# Patient Record
Sex: Male | Born: 1965 | Race: White | Hispanic: No | Marital: Married | Smoking: Never smoker
Health system: Southern US, Community
[De-identification: ages and names within clinical notes are randomized; demographics above are authoritative.]

---

## 2004-03-17 ENCOUNTER — Encounter: Admission: RE | Admit: 2004-03-17 | Discharge: 2004-03-17 | Payer: Self-pay | Admitting: Internal Medicine

## 2004-09-04 ENCOUNTER — Ambulatory Visit (HOSPITAL_COMMUNITY): Admission: RE | Admit: 2004-09-04 | Discharge: 2004-09-04 | Payer: Self-pay | Admitting: Family Medicine

## 2005-09-18 ENCOUNTER — Emergency Department: Payer: Self-pay | Admitting: Unknown Physician Specialty

## 2006-02-18 ENCOUNTER — Emergency Department (HOSPITAL_COMMUNITY): Admission: EM | Admit: 2006-02-18 | Discharge: 2006-02-18 | Payer: Self-pay | Admitting: Emergency Medicine

## 2008-05-26 ENCOUNTER — Emergency Department: Payer: Self-pay | Admitting: Emergency Medicine

## 2009-10-21 ENCOUNTER — Emergency Department: Payer: Self-pay | Admitting: Emergency Medicine

## 2011-07-09 ENCOUNTER — Ambulatory Visit (INDEPENDENT_AMBULATORY_CARE_PROVIDER_SITE_OTHER): Payer: 59 | Admitting: Internal Medicine

## 2011-07-09 ENCOUNTER — Encounter: Payer: Self-pay | Admitting: Internal Medicine

## 2011-07-09 VITALS — BP 146/90 | HR 65 | Temp 98.3°F | Resp 16 | Ht 73.0 in | Wt 231.2 lb

## 2011-07-09 DIAGNOSIS — M542 Cervicalgia: Secondary | ICD-10-CM | POA: Insufficient documentation

## 2011-07-09 DIAGNOSIS — M25551 Pain in right hip: Secondary | ICD-10-CM

## 2011-07-09 DIAGNOSIS — E291 Testicular hypofunction: Secondary | ICD-10-CM

## 2011-07-09 DIAGNOSIS — E349 Endocrine disorder, unspecified: Secondary | ICD-10-CM

## 2011-07-09 DIAGNOSIS — N529 Male erectile dysfunction, unspecified: Secondary | ICD-10-CM

## 2011-07-09 DIAGNOSIS — M5090 Cervical disc disorder, unspecified, unspecified cervical region: Secondary | ICD-10-CM

## 2011-07-09 DIAGNOSIS — M25559 Pain in unspecified hip: Secondary | ICD-10-CM

## 2011-07-09 MED ORDER — TRAMADOL HCL 50 MG PO TABS: 50.0000 mg | ORAL_TABLET | Freq: Three times a day (TID) | ORAL | Status: AC | PRN
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
## 2011-07-09 NOTE — Patient Instructions (Addendum)
Our office will call you with the appt for the x rays and MRI to be done of hips and spine at [facility]   You may combine tramadol with tylenol up to 3 times daily for pain.   Please have your blood pressure rechecked in the next few weeks and call if it is > 130/80 to confirm that you have hypertension  Please sign a release of medical records from for Dr. Orson Slick and Dr. Melina Schools old practice

## 2011-07-12 ENCOUNTER — Encounter: Payer: Self-pay | Admitting: Internal Medicine

## 2011-07-12 NOTE — Assessment & Plan Note (Signed)
With loss of strength in shoulder/arm.  Plain films and MRI to be ordered to rule out cervical nerve root impingement.

## 2011-07-12 NOTE — Assessment & Plan Note (Signed)
Secondary to hypotestosteronism.  Cannot afford Cialis.  Recommended return to Urology.

## 2011-07-15 ENCOUNTER — Ambulatory Visit: Payer: Self-pay | Admitting: Neurology

## 2011-07-16 ENCOUNTER — Telehealth: Payer: Self-pay | Admitting: Internal Medicine

## 2011-07-16 DIAGNOSIS — M4802 Spinal stenosis, cervical region: Secondary | ICD-10-CM

## 2011-07-16 NOTE — Telephone Encounter (Signed)
Wanting MRI results

## 2011-07-16 NOTE — Telephone Encounter (Signed)
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  I am making a referral to [facility] so he can see a neurosurgeon,  He will need to take the MRI images with him when he gets the appt on a disk.which [facility] will give him

## 2011-07-16 NOTE — Telephone Encounter (Signed)
Left message asking patient to return my call.

## 2011-07-19 ENCOUNTER — Telehealth: Payer: Self-pay | Admitting: Internal Medicine

## 2011-07-19 NOTE — Telephone Encounter (Signed)
Kurt Bender Pt would like neuro appointment [place] if possible.please call pt asap  Kurt Bender Pt also wanted to know if he could work out. With the hernated disc problem he has

## 2011-07-19 NOTE — Telephone Encounter (Signed)
[phone] Patient returning your call.

## 2011-07-19 NOTE — Telephone Encounter (Signed)
Patient notified of results.  He will be waiting on the appt from [facility].

## 2011-07-19 NOTE — Telephone Encounter (Signed)
Patient is asking if it is okay for him to work out with the disc problem

## 2011-07-19 NOTE — Telephone Encounter (Signed)
There are no neurosurgeons in [place],  Only [place],  [place] or [place].  I do not advise working out unless he just wants to concentrate on legs,  . Please proceed with [place] [facility] referral.

## 2011-07-20 NOTE — Telephone Encounter (Signed)
Patient notified

## 2011-07-26 ENCOUNTER — Encounter: Payer: Self-pay | Admitting: Internal Medicine

## 2011-07-27 ENCOUNTER — Telehealth: Payer: Self-pay | Admitting: Internal Medicine

## 2011-07-27 NOTE — Telephone Encounter (Signed)
Patient wants a referral to Dr. Rise Mu for a second opinion [phone]

## 2011-08-11 ENCOUNTER — Telehealth: Payer: Self-pay | Admitting: Internal Medicine

## 2011-08-11 NOTE — Telephone Encounter (Signed)
This was entered in wrong chart.

## 2011-08-11 NOTE — Telephone Encounter (Signed)
Patient's wife called wanting a call back from the voicemail she left this morning.

## 2013-06-12 ENCOUNTER — Ambulatory Visit: Payer: Self-pay | Admitting: Neurology

## 2013-06-14 IMAGING — CR DG LUMBAR SPINE 2-3V
1 series · 3 of 3 positions shown · non-contrast
Comparison: none

REASON FOR EXAM: MRI clearance
COMMENTS:

PROCEDURE:     DXR - DXR LUMBAR SPINE AP AND LATERAL  - July 15, 2011  [DATE]
RESULT:     Comparison: AP pelvis 10/21/2009

[Series 1: ap · 0.17mm/px · 3 of 3 slices shown]
[im 1/3]
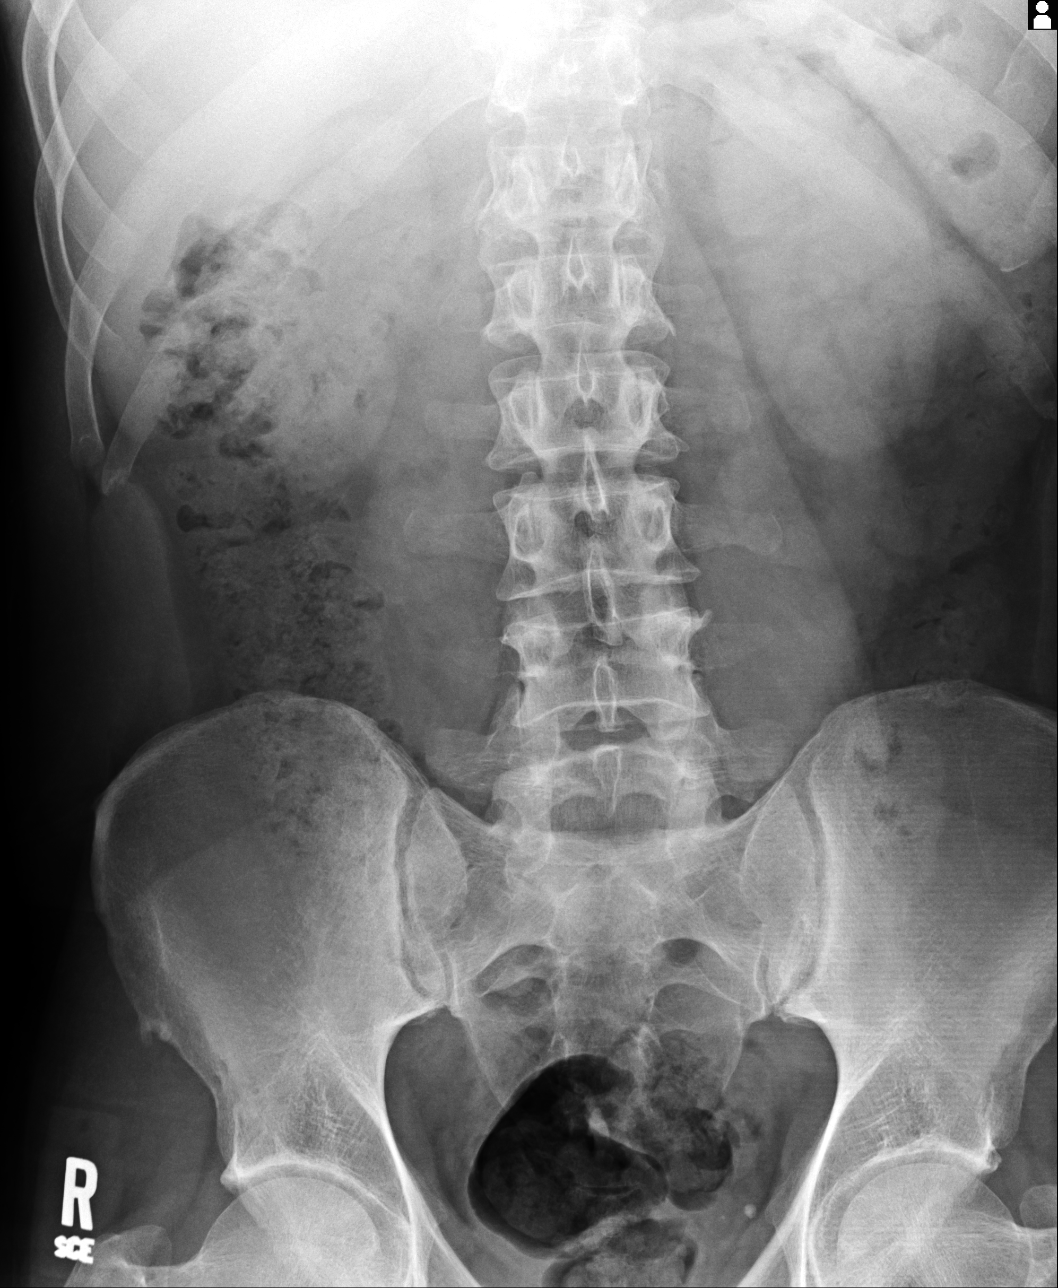
[im 2/3]
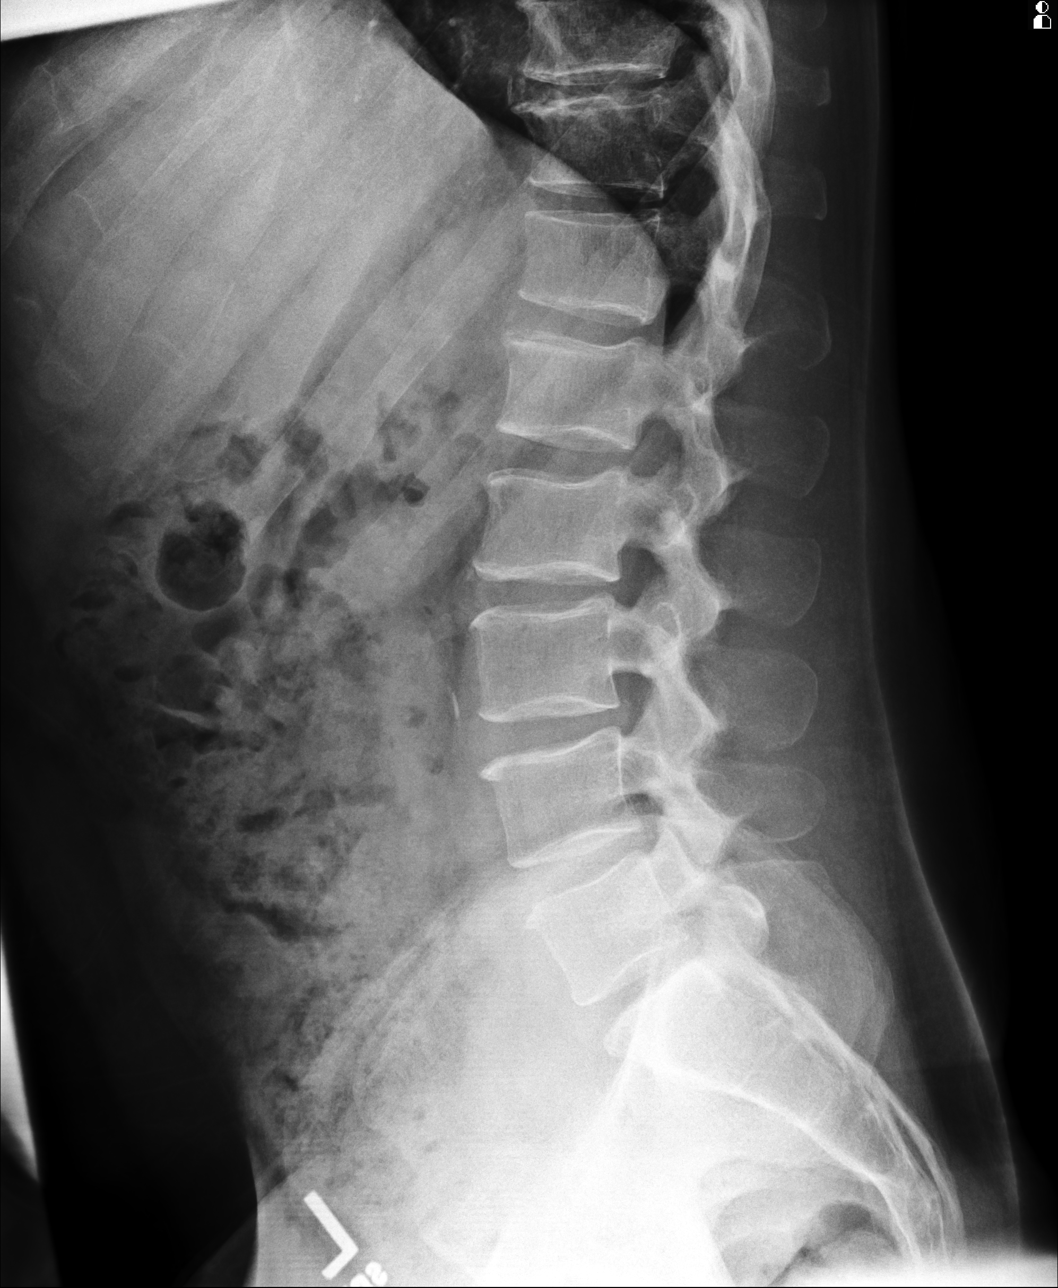
[im 3/3]
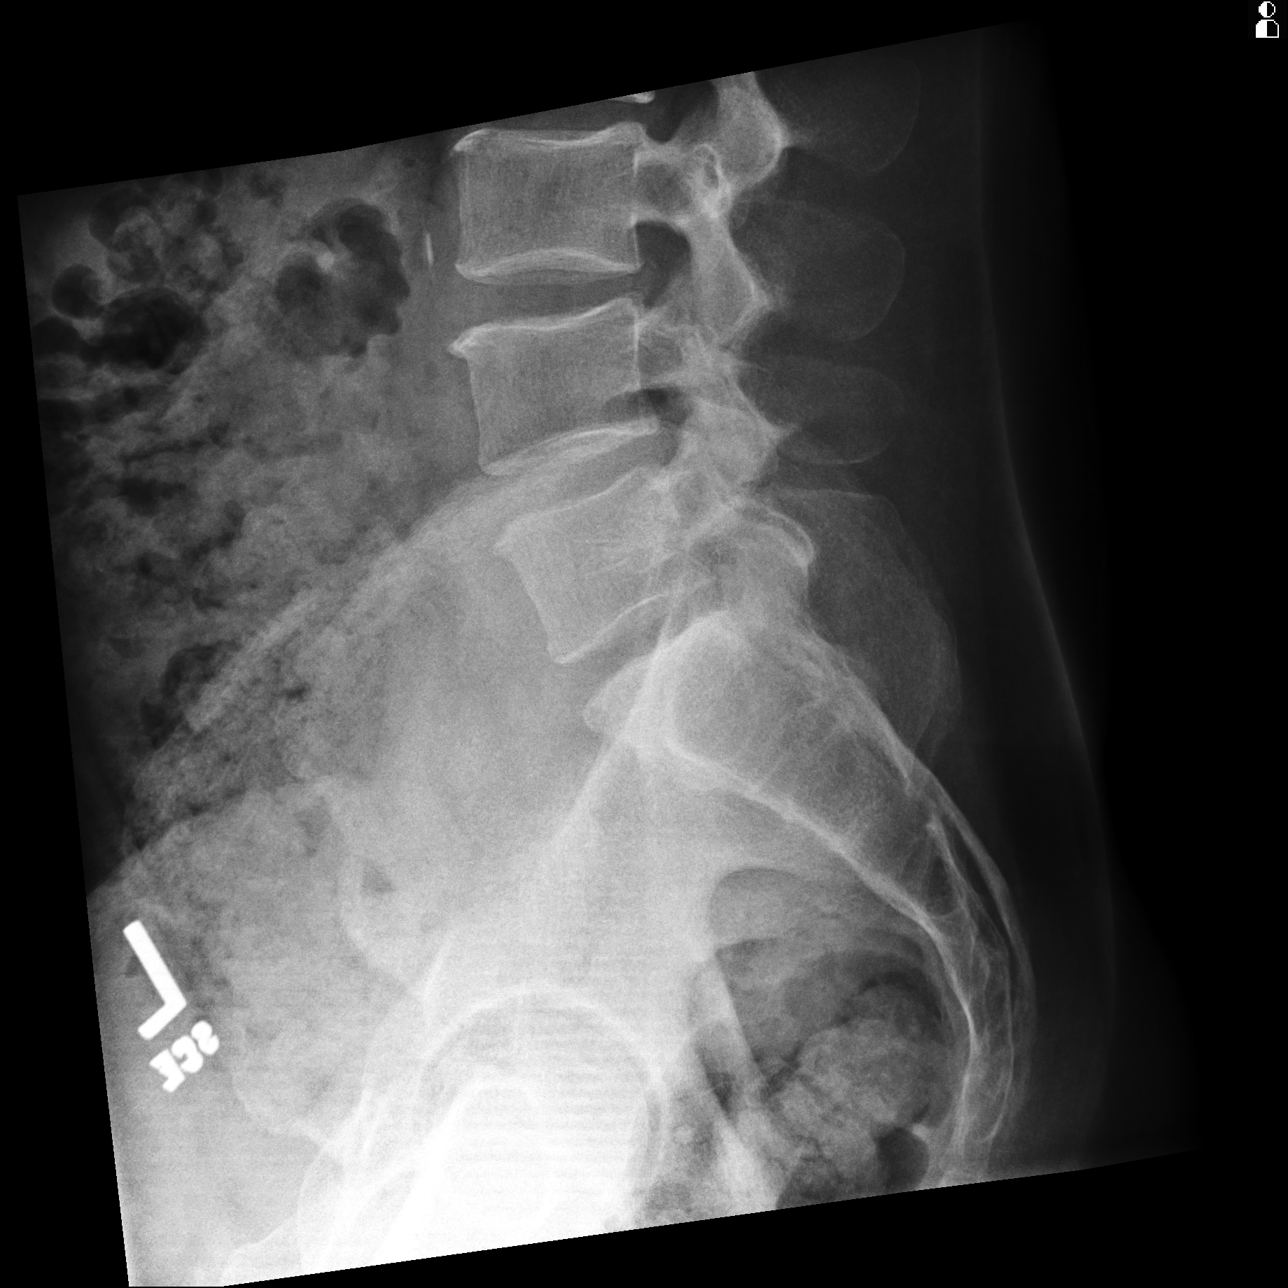

[3 of 3 positions shown; findings below may reference images not displayed]

FINDINGS: No radiopaque foreign bodies identified. Small calcifications in pelvis are
likely related to phleboliths. There is mild age indeterminate anterior
height loss of the T11 and T12 vertebral bodies. Correlate with patient's
history and site of pain.
IMPRESSION: 1. No radiopaque foreign body identified.
2. Mild age indeterminate anterior height loss of the T11 and T12 vertebral
bodies. Correlate with patient's history and site of pain.

## 2018-03-01 DIAGNOSIS — Z79899 Other long term (current) drug therapy: Secondary | ICD-10-CM | POA: Diagnosis not present

## 2018-03-01 DIAGNOSIS — N401 Enlarged prostate with lower urinary tract symptoms: Secondary | ICD-10-CM | POA: Diagnosis not present

## 2018-03-01 DIAGNOSIS — E291 Testicular hypofunction: Secondary | ICD-10-CM | POA: Diagnosis not present

## 2018-03-01 DIAGNOSIS — N5201 Erectile dysfunction due to arterial insufficiency: Secondary | ICD-10-CM | POA: Diagnosis not present

## 2018-08-28 DIAGNOSIS — Z1159 Encounter for screening for other viral diseases: Secondary | ICD-10-CM | POA: Diagnosis not present

## 2018-08-28 DIAGNOSIS — E291 Testicular hypofunction: Secondary | ICD-10-CM | POA: Diagnosis not present

## 2018-08-28 DIAGNOSIS — Z79899 Other long term (current) drug therapy: Secondary | ICD-10-CM | POA: Diagnosis not present

## 2018-08-28 DIAGNOSIS — N401 Enlarged prostate with lower urinary tract symptoms: Secondary | ICD-10-CM | POA: Diagnosis not present

## 2018-08-28 DIAGNOSIS — Z20828 Contact with and (suspected) exposure to other viral communicable diseases: Secondary | ICD-10-CM | POA: Diagnosis not present

## 2018-08-28 DIAGNOSIS — N5201 Erectile dysfunction due to arterial insufficiency: Secondary | ICD-10-CM | POA: Diagnosis not present

## 2018-10-05 ENCOUNTER — Other Ambulatory Visit: Payer: Self-pay

## 2018-10-05 ENCOUNTER — Ambulatory Visit
Admission: EM | Admit: 2018-10-05 | Discharge: 2018-10-05 | Disposition: A | Discharge status: Home / Self Care | Payer: BC Managed Care – PPO | Attending: Family Medicine | Admitting: Family Medicine

## 2018-10-05 ENCOUNTER — Ambulatory Visit (INDEPENDENT_AMBULATORY_CARE_PROVIDER_SITE_OTHER): Payer: BC Managed Care – PPO

## 2018-10-05 DIAGNOSIS — Z7189 Other specified counseling: Secondary | ICD-10-CM | POA: Diagnosis not present

## 2018-10-05 DIAGNOSIS — R05 Cough: Secondary | ICD-10-CM | POA: Diagnosis not present

## 2018-10-05 DIAGNOSIS — J069 Acute upper respiratory infection, unspecified: Secondary | ICD-10-CM | POA: Diagnosis not present

## 2018-10-05 DIAGNOSIS — B9789 Other viral agents as the cause of diseases classified elsewhere: Secondary | ICD-10-CM | POA: Diagnosis not present

## 2018-10-05 DIAGNOSIS — R0602 Shortness of breath: Secondary | ICD-10-CM

## 2018-10-05 MED ORDER — PSEUDOEPH-BROMPHEN-DM 30-2-10 MG/5ML PO SYRP: 5.0000 mL | ORAL_SOLUTION | Freq: Four times a day (QID) | ORAL | 0 refills | Status: DC | PRN

## 2018-10-05 NOTE — ED Provider Notes (Signed)
[place], [place]   Name: Kurt Bender DOB: 03-21-1965 MRN: [id number] CSN: [id number] PCP: Crecencio Mc, MD  Arrival date and time:  10/05/18 1214  Chief Complaint:  Cough   NOTE: Prior to seeing the patient today, I have reviewed the triage nursing documentation and vital signs. Clinical staff has updated patient's PMH/PSHx, current medication list, and drug allergies/intolerances to ensure comprehensive history available to assist in medical decision making.   History:   HPI: Kurt Bender is a 53 y.o. male who presents today with complaints of worsening cough and congestion x 2 weeks. Cough tends to be worse at night secondary to nasal drainage. Patient has been feeling more short of breath and has the sensation of "congestion being stuck in his chest" over the last week. Patient denies any associated fevers. He has a minor sore throat that is worse in the morning. He complains of his RIGHT ear feeling "clogged". Patient denies contacts with anyone known to be infected with SARS-CoV-2 (novel coronavirus). In efforts to conservatively manage his symptoms at home, the patient notes that he has been using Mucinex and allergy medication, both of which have not really helped to improve his symptoms.    History reviewed. No pertinent past medical history.  History reviewed. No pertinent surgical history.  Family History  Problem Relation Age of Onset  . Heart disease Father   . Lung cancer Father     Social History   Tobacco Use  . Smoking status: Never Smoker  . Smokeless tobacco: Never Used  Substance Use Topics  . Alcohol use: Not Currently  . Drug use: Not Currently    Patient Active Problem List   Diagnosis Date Noted  . Hypotestosteronism 07/09/2011  . Erectile dysfunction 07/09/2011  . Cervicalgia 07/09/2011    Home Medications:    Current Meds  Medication Sig  . Multiple Vitamin (MULTIVITAMIN) tablet Take 1 tablet by mouth daily.  . Testosterone (TESTIM TD) Place  onto the skin as directed.    Allergies:   Patient has no known allergies.  Review of Systems (ROS): Review of Systems  Constitutional: Negative for chills and fever.  HENT: Positive for congestion and sore throat. Negative for rhinorrhea, sinus pressure, sinus pain and trouble swallowing.   Respiratory: Positive for cough and shortness of breath.   Cardiovascular: Negative for chest pain and palpitations.  Gastrointestinal: Negative for nausea and vomiting.  Musculoskeletal: Negative for myalgias.  Skin: Negative for color change and rash.  Hematological: Negative for adenopathy.  All other systems reviewed and are negative.    Vital Signs: Today's Vitals   10/05/18 1229 10/05/18 1232 10/05/18 1345  BP:  (!) 143/96   Pulse:  (!) 53   Resp:  18   Temp:  98.2 F (36.8 C)   TempSrc:  Oral   SpO2:  97%   Weight: 225 lb (102.1 kg)    Height: 6\' 2"  (1.88 m)    PainSc: 2   2     Physical Exam: Physical Exam  Constitutional: He is oriented to person, place, and time and well-developed, well-nourished, and in no distress. No distress.  HENT:  Head: Normocephalic and atraumatic.  Right Ear: Hearing, external ear and ear canal normal. A middle ear effusion (mild serous) is present.  Left Ear: Hearing, tympanic membrane, external ear and ear canal normal.  Nose: Mucosal edema present. No rhinorrhea or sinus tenderness.  Mouth/Throat: Uvula is midline. Posterior oropharyngeal erythema present. No oropharyngeal exudate or posterior oropharyngeal edema.  Eyes: Pupils are equal, round, and reactive to light. Conjunctivae and EOM are normal.  Neck: Normal range of motion. Neck supple. No tracheal deviation present.  Cardiovascular: Normal rate, regular rhythm, normal heart sounds and intact distal pulses. Exam reveals no gallop and no friction rub.  No murmur heard. Pulmonary/Chest: Effort normal and breath sounds normal. No respiratory distress. He has no wheezes. He has no rales.   Abdominal: Soft. Bowel sounds are normal. He exhibits no distension. There is no abdominal tenderness.  Musculoskeletal: Normal range of motion.  Lymphadenopathy:    He has no cervical adenopathy.  Neurological: He is alert and oriented to person, place, and time. Gait normal. GCS score is 15.  Skin: Skin is warm and dry. No rash noted. He is not diaphoretic.  Psychiatric: Mood, memory, affect and judgment normal.  Nursing note and vitals reviewed.   Urgent Care Treatments / Results:   LABS: PLEASE NOTE: all labs that were ordered this encounter are listed, however only abnormal results are displayed. Labs Reviewed  NOVEL CORONAVIRUS, NAA (HOSPITAL ORDER, SEND-OUT TO REF LAB)    EKG: -None  RADIOLOGY: Dg Chest 2 View  Result Date: 10/05/2018 CLINICAL DATA:  Cough and congestion for 2 weeks EXAM: CHEST - 2 VIEW COMPARISON:  None. FINDINGS: Hardware in the lower cervical spine. No focal opacity or pleural effusion. Normal cardiomediastinal silhouette. No pneumothorax. IMPRESSION: No active cardiopulmonary disease. Electronically Signed   By: Jasmine PangKim  Fujinaga M.D.   On: 10/05/2018 13:31    PROCEDURES: Procedures  MEDICATIONS RECEIVED THIS VISIT: Medications - No data to display  PERTINENT CLINICAL COURSE NOTES/UPDATES:   Initial Impression / Assessment and Plan / Urgent Care Course:  Pertinent labs & imaging results that were available during my care of the patient were personally reviewed by me and considered in my medical decision making (see lab/imaging section of note for values and interpretations).  Kurt Bender is a 53 y.o. male who [facility] today with complaints of Cough   Patient is well appearing overall in clinic today. He does not appear to be in any acute distress. Presenting symptoms (see HPI) and exam as documented above. He presents with symptoms that are associated with SARS-CoV-2 (novel coronavirus). Patient requesting testing, which is  reasonable. Patient collected SARS-CoV-2 swab via facility approved self collection process today under the supervision of certified clinical staff. Discussed variable turn around times associated with testing, as swabs are being processed at [facility], and have been taking as long as 7 days. He was advised to self quarantine, per [facility] DH[facility] guidelines, until negative results received.   Radiographs of the chest performed today in clinic revealed no acute cardiopulmonary process; no evidence of peribronchial thickening, areas of consolidation, or focal infiltrates. Symptoms and exam felt to be consistent with viral URI. Dicussed supportive care measures at home during acute phase of illness. Patient to rest as much as possible. He was encouraged to ensure adequate hydration (water and ORS). He was advised to continue Mucinex and allergy medications. May add fluticasone to help with congestion. Will send in prescription from Bromfed cough syrup to help with reported cough. Patient to return call to clinic on Saturday if not improving.   Discussed follow up with primary care physician in 1 week for re-evaluation. I have reviewed the follow up and strict return precautions for any new or worsening symptoms. Patient is aware of symptoms that would be deemed urgent/emergent, and would thus require further evaluation either here or  in the emergency department. At the time of discharge, he verbalized understanding and consent with the discharge plan as it was reviewed with him. All questions were fielded by provider and/or clinic staff prior to patient discharge.    Final Clinical Impressions / Urgent Care Diagnoses:   Final diagnoses:  Viral URI with cough  Advice Given About Covid-19 Virus Infection    New Prescriptions:  [place] Controlled Substance Registry consulted? Not Applicable  Meds ordered this encounter  Medications  . brompheniramine-pseudoephedrine-DM 30-2-10 MG/5ML syrup    Sig: Take 5 mLs by mouth  4 (four) times daily as needed.    Dispense:  120 mL    Refill:  0    Recommended Follow up Care:  Patient encouraged to follow up with the following provider within the specified time frame, or sooner as dictated by the severity of his symptoms. As always, he was instructed that for any urgent/emergent care needs, he should seek care either here or in the emergency department for more immediate evaluation.  Follow-up Information    Sherlene Shamsullo, Teresa L, MD In 1 week.   Specialty: Internal Medicine Why: General reassessment of symptoms if not improving Contact information: [address] [place] [address] [phone]         NOTE: This note was prepared using Dragon dictation software along with smaller phrase technology. Despite my best ability to proofread, there is the potential that transcriptional errors may still occur from this process, and are completely unintentional.    Verlee MonteGray, Manahil Vanzile E, NP 10/05/18 1355

## 2018-10-05 NOTE — Discharge Instructions (Addendum)
It was very nice seeing you today in clinic. Thank you for entrusting me with your care.   Please utilize the medications that we discussed. Your prescriptions have been called in to your pharmacy. Continue allergy medication and Mucinex. Stay hydrated.  Make arrangements to follow up with your regular doctor in 1 week for re-evaluation if not improving.  If your symptoms/condition worsens, please seek follow up care either here or in the ER. Please remember, our [place] providers are "right here with you" when you need [place].   Again, it was my pleasure to take care of you today. Thank you for choosing our clinic. I hope that you start to feel better quickly.   Honor Loh, MSN, APRN, FNP-C, CEN Advanced Practice Provider [facility]

## 2018-10-05 NOTE — ED Triage Notes (Signed)
Patient complains of cough and shortness of breath that started a little over 2 weeks ago, worse at night.

## 2018-10-06 LAB — NOVEL CORONAVIRUS, NAA (HOSP ORDER, SEND-OUT TO REF LAB; TAT 18-24 HRS): SARS-CoV-2, NAA: NOT DETECTED

## 2018-10-09 ENCOUNTER — Telehealth: Payer: Self-pay

## 2018-10-09 ENCOUNTER — Encounter (HOSPITAL_COMMUNITY): Payer: Self-pay

## 2018-10-09 MED ORDER — PSEUDOEPH-BROMPHEN-DM 30-2-10 MG/5ML PO SYRP: 5.0000 mL | ORAL_SOLUTION | Freq: Four times a day (QID) | ORAL | 0 refills | Status: DC | PRN

## 2018-10-09 NOTE — Telephone Encounter (Signed)
Patient called in stating that he spilled his Bromfed and needed a refill. Spoke with Marylene Land, NP who okayed the refill. Patient advised that Rx has been sent to the pharmacy. [facility]

## 2019-04-10 DIAGNOSIS — Z79899 Other long term (current) drug therapy: Secondary | ICD-10-CM | POA: Diagnosis not present

## 2019-04-10 DIAGNOSIS — N5201 Erectile dysfunction due to arterial insufficiency: Secondary | ICD-10-CM | POA: Diagnosis not present

## 2019-04-10 DIAGNOSIS — N401 Enlarged prostate with lower urinary tract symptoms: Secondary | ICD-10-CM | POA: Diagnosis not present

## 2019-04-10 DIAGNOSIS — E291 Testicular hypofunction: Secondary | ICD-10-CM | POA: Diagnosis not present

## 2019-04-16 DIAGNOSIS — E291 Testicular hypofunction: Secondary | ICD-10-CM | POA: Diagnosis not present

## 2019-04-16 DIAGNOSIS — Z79899 Other long term (current) drug therapy: Secondary | ICD-10-CM | POA: Diagnosis not present

## 2019-04-16 DIAGNOSIS — N401 Enlarged prostate with lower urinary tract symptoms: Secondary | ICD-10-CM | POA: Diagnosis not present

## 2019-09-20 ENCOUNTER — Other Ambulatory Visit: Payer: Self-pay

## 2019-09-20 ENCOUNTER — Ambulatory Visit
Admission: EM | Admit: 2019-09-20 | Discharge: 2019-09-20 | Disposition: A | Discharge status: Home / Self Care | Payer: HRSA Program | Attending: Family Medicine | Admitting: Family Medicine

## 2019-09-20 DIAGNOSIS — Z20822 Contact with and (suspected) exposure to covid-19: Secondary | ICD-10-CM

## 2019-09-20 LAB — SARS CORONAVIRUS 2 (TAT 6-24 HRS): SARS Coronavirus 2: NEGATIVE

## 2019-09-20 NOTE — ED Triage Notes (Signed)
Wife tested positive for COVID yesterday. Pt without sx other than feeling a little tired. Has had one COVID vaccine to date.

## 2019-09-20 NOTE — ED Provider Notes (Signed)
[facility]    CSN: [id number] Arrival date & time: 09/20/19  1106  History   Chief Complaint Chief Complaint  Patient presents with   Covid Exposure   HPI  54 year old male presents with the above complaint.  Patient reports that his wife tested positive for COVID-19 yesterday.  Patient states that he is fatigued but has no other symptoms.  Patient has had the first dose of Covid vaccine.  Desires testing today.  No other associated symptoms.  No other complaints.  Patient Active Problem List   Diagnosis Date Noted   Hypotestosteronism 07/09/2011   Erectile dysfunction 07/09/2011   Cervicalgia 07/09/2011   Home Medications    Prior to Admission medications   Medication Sig Start Date End Date Taking? Authorizing Provider  Multiple Vitamin (MULTIVITAMIN) tablet Take 1 tablet by mouth daily.    [provider]  Testosterone (TESTIM TD) Place onto the skin as directed.    [provider]    Family History Family History  Problem Relation Age of Onset   Heart disease Father    Lung cancer Father     Social History Social History   Tobacco Use   Smoking status: Never Smoker   Smokeless tobacco: Never Used  Building services engineer Use: Never used  Substance Use Topics   Alcohol use: Not Currently   Drug use: Not Currently     Allergies   Patient has no known allergies.   Review of Systems Review of Systems  Constitutional: Positive for fatigue.   Physical Exam Triage Vital Signs ED Triage Vitals  Enc Vitals Group     BP 09/20/19 1138 (!) 155/90     Pulse Rate 09/20/19 1138 (!) 57     Resp 09/20/19 1138 17     Temp 09/20/19 1138 98.3 F (36.8 C)     Temp Source 09/20/19 1138 Oral     SpO2 09/20/19 1138 99 %     Weight 09/20/19 1140 224 lb (101.6 kg)     Height 09/20/19 1140 6\' 2"  (1.88 m)     Head Circumference --      Peak Flow --      Pain Score 09/20/19 1138 0     Pain Loc --      Pain Edu? --      Excl.  in GC? --    Updated Vital Signs BP (!) 155/90 (BP Location: Right Arm)    Pulse (!) 57    Temp 98.3 F (36.8 C) (Oral)    Resp 17    Ht 6\' 2"  (1.88 m)    Wt 101.6 kg    SpO2 99%    BMI 28.76 kg/m   Visual Acuity Right Eye Distance:   Left Eye Distance:   Bilateral Distance:    Right Eye Near:   Left Eye Near:    Bilateral Near:     Physical Exam Vitals and nursing note reviewed.  Constitutional:      General: He is not in acute distress.    Appearance: Normal appearance. He is not ill-appearing.  HENT:     Head: Normocephalic and atraumatic.  Eyes:     General:        Right eye: No discharge.        Left eye: No discharge.     Conjunctiva/sclera: Conjunctivae normal.  Cardiovascular:     Rate and Rhythm: Regular rhythm. Bradycardia present.  Pulmonary:     Effort: Pulmonary effort is  normal.     Breath sounds: Normal breath sounds. No wheezing, rhonchi or rales.  Neurological:     Mental Status: He is alert.  Psychiatric:        Mood and Affect: Mood normal.        Behavior: Behavior normal.    UC Treatments / Results  Labs (all labs ordered are listed, but only abnormal results are displayed) Labs Reviewed  SARS CORONAVIRUS 2 (TAT 6-24 HRS)    EKG   Radiology No results found.  Procedures Procedures (including critical care time)  Medications Ordered in UC Medications - No data to display  Initial Impression / Assessment and Plan / UC Course  I have reviewed the triage vital signs and the nursing notes.  Pertinent labs & imaging results that were available during my care of the patient were reviewed by me and considered in my medical decision making (see chart for details).    54 year old male presents with exposure to COVID-19.  He has fatigue.  Unsure if this is a true symptom.  Awaiting Covid test results.  Supportive care.  Work note given.  Final Clinical Impressions(s) / UC Diagnoses   Final diagnoses:  Close exposure to COVID-19 virus      Discharge Instructions     Results available in 24 hours.  Stay home.  Take care  Dr. Adriana Simas     ED Prescriptions    None     PDMP not reviewed this encounter.   Tommie Sams, [place] 09/20/19 1719

## 2019-09-20 NOTE — Discharge Instructions (Signed)
Results available in 24 hours.  Stay home.  Take care  Dr. Wilhelmena Zea   

## 2019-10-16 DIAGNOSIS — E291 Testicular hypofunction: Secondary | ICD-10-CM | POA: Diagnosis not present

## 2019-10-16 DIAGNOSIS — N5201 Erectile dysfunction due to arterial insufficiency: Secondary | ICD-10-CM | POA: Diagnosis not present

## 2019-10-19 DIAGNOSIS — Z79899 Other long term (current) drug therapy: Secondary | ICD-10-CM | POA: Diagnosis not present

## 2019-10-19 DIAGNOSIS — E291 Testicular hypofunction: Secondary | ICD-10-CM | POA: Diagnosis not present

## 2019-10-19 DIAGNOSIS — N401 Enlarged prostate with lower urinary tract symptoms: Secondary | ICD-10-CM | POA: Diagnosis not present

## 2019-12-03 DIAGNOSIS — H1032 Unspecified acute conjunctivitis, left eye: Secondary | ICD-10-CM | POA: Diagnosis not present

## 2019-12-20 NOTE — Telephone Encounter (Signed)
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

## 2019-12-24 ENCOUNTER — Other Ambulatory Visit: Payer: Self-pay

## 2019-12-24 ENCOUNTER — Encounter: Payer: Self-pay | Admitting: Internal Medicine

## 2019-12-24 ENCOUNTER — Ambulatory Visit: Payer: BC Managed Care – PPO | Admitting: Internal Medicine

## 2019-12-24 VITALS — BP 152/102 | HR 61 | Temp 98.6°F | Resp 15 | Ht 74.0 in | Wt 234.2 lb

## 2019-12-24 DIAGNOSIS — F1010 Alcohol abuse, uncomplicated: Secondary | ICD-10-CM

## 2019-12-24 DIAGNOSIS — M4692 Unspecified inflammatory spondylopathy, cervical region: Secondary | ICD-10-CM | POA: Diagnosis not present

## 2019-12-24 DIAGNOSIS — I10 Essential (primary) hypertension: Secondary | ICD-10-CM | POA: Diagnosis not present

## 2019-12-24 DIAGNOSIS — R012 Other cardiac sounds: Secondary | ICD-10-CM | POA: Diagnosis not present

## 2019-12-24 DIAGNOSIS — Z79899 Other long term (current) drug therapy: Secondary | ICD-10-CM

## 2019-12-24 DIAGNOSIS — M542 Cervicalgia: Secondary | ICD-10-CM

## 2019-12-24 DIAGNOSIS — F4321 Adjustment disorder with depressed mood: Secondary | ICD-10-CM

## 2019-12-24 DIAGNOSIS — E349 Endocrine disorder, unspecified: Secondary | ICD-10-CM

## 2019-12-24 DIAGNOSIS — M5412 Radiculopathy, cervical region: Secondary | ICD-10-CM

## 2019-12-24 LAB — HEPATIC FUNCTION PANEL
ALT: 20 U/L (ref 0–53)
AST: 16 U/L (ref 0–37)
Albumin: 4.5 g/dL (ref 3.5–5.2)
Alkaline Phosphatase: 77 U/L (ref 39–117)
Bilirubin, Direct: 0.1 mg/dL (ref 0.0–0.3)
Total Bilirubin: 0.6 mg/dL (ref 0.2–1.2)
Total Protein: 6.6 g/dL (ref 6.0–8.3)

## 2019-12-24 LAB — MICROALBUMIN / CREATININE URINE RATIO
Creatinine,U: 203.8 mg/dL
Microalb Creat Ratio: 0.8 mg/g (ref 0.0–30.0)
Microalb, Ur: 1.5 mg/dL (ref 0.0–1.9)

## 2019-12-24 LAB — BASIC METABOLIC PANEL
BUN: 16 mg/dL (ref 6–23)
CO2: 31 mEq/L (ref 19–32)
Calcium: 9.2 mg/dL (ref 8.4–10.5)
Chloride: 105 mEq/L (ref 96–112)
Creatinine, Ser: 1.01 mg/dL (ref 0.40–1.50)
GFR: 85.11 mL/min (ref >60.00)
Glucose, Bld: 92 mg/dL (ref 70–99)
Potassium: 4.3 mEq/L (ref 3.5–5.1)
Sodium: 141 mEq/L (ref 135–145)

## 2019-12-24 LAB — LDL CHOLESTEROL, DIRECT: Direct LDL: 105 mg/dL

## 2019-12-24 LAB — LIPID PANEL
Cholesterol: 173 mg/dL (ref 0–200)
HDL: 41.2 mg/dL (ref >39.00)
NonHDL: 132.12
Total CHOL/HDL Ratio: 4
Triglycerides: 303 mg/dL — ABNORMAL HIGH (ref 0.0–149.0)
VLDL: 60.6 mg/dL — ABNORMAL HIGH (ref 0.0–40.0)

## 2019-12-24 MED ORDER — TRAMADOL HCL 50 MG PO TABS: 50.0000 mg | ORAL_TABLET | Freq: Three times a day (TID) | ORAL | 0 refills | Status: AC | PRN

## 2019-12-24 MED ORDER — AMLODIPINE BESYLATE 2.5 MG PO TABS: 2.5000 mg | ORAL_TABLET | Freq: Every day | ORAL | 3 refills | Status: AC
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
## 2019-12-24 NOTE — Progress Notes (Signed)
Subjective:  Patient ID: Hideo Googe, male    DOB: 1965-05-29  Age: 54 y.o. MRN: [id number]  CC: The primary encounter diagnosis was Elevated blood pressure reading in office with diagnosis of hypertension. Diagnoses of Alcohol abuse, Cervical spondylitis with radiculitis (HCC), Abnormal fourth heart sound (S4), Cervicalgia, Hypotestosteronism, Long-term use of high-risk medication, and Feeling grief were also pertinent to this visit.  HPI Curry Seefeldt presents for evaluation of neck pain .  This visit occurred during the SARS-CoV-2 public health emergency.  Safety protocols were in place, including screening questions prior to the visit, additional usage of staff PPE, and extensive cleaning of exam room while observing appropriate contact time as indicated for disinfecting solutions.    Patient has received at least one dose of the available COVID 19 vaccine without complications.  Patient continues to mask when outside of the home except when walking in yard or at safe distances from others .  Patient denies any change in mood or development of unhealthy behaviors resuting from the pandemic's restriction of activities and socialization.    Mr Callander is a 54 yr old male who has been lost to follow up with PCP for 8 YEARS.  HE WAS scheduled today as an acute visit for evaluation and treatment of chronic progressive neck pain.  He has a history C5-C6 fusion for herniated disk at [facility] by neurosurgeon  Haglund. Per patient the fusion was not successful and during a follow up with Haglund  in 2019, additional surgery was offered but deferred by patient.  HOwever patient states that his symptoms have worsened, and he feels subjective weakness in the left arm AS WELL AS radicular pain which is episodic. He notes that the left  arm feels heavy,  No long able  to weight lift.   Has stopped cycling as well.  He is requesting a return to Dr Rise Mu and a repeat MRI  Has been using up to 800 mg ibuprofen once  or twice daily for management of pain   He has a history of testosterone deficiency ans sees  Urology (Dr. Sheppard Penton) for testoserone replacement   He has no history of hypertension.  He is grieving the loss of his mother who  died of "bone marrow cancer"  One month ago. He did spend a  week at her bedside down in [facility].  He is also in the middle of divorcing his second wife.  And admits to using alcohol to excess to manage his anxiety and grief.    History [place] has no past medical history on file.   He has no past surgical history on file.   His family history includes Heart disease in his father; Lung cancer in his father.He reports that he has never smoked. He has never used smokeless tobacco. He reports previous alcohol use. He reports previous drug use.  Outpatient Medications Prior to Visit  Medication Sig Dispense Refill  . Testosterone 20.25 MG/ACT (1.62%) GEL 1 pump daily on each shoulder (2 pumps total)    . Multiple Vitamin (MULTIVITAMIN) tablet Take 1 tablet by mouth daily. (Patient not taking: Reported on 12/24/2019)    . Testosterone (TESTIM TD) Place onto the skin as directed. (Patient not taking: Reported on 12/24/2019)     No facility-administered medications prior to visit.    Review of Systems:  Patient denies headache, fevers, malaise, unintentional weight loss, skin rash, eye pain, sinus congestion and sinus pain, sore throat, dysphagia,  hemoptysis , cough, dyspnea, wheezing,  chest pain, palpitations, orthopnea, edema, abdominal pain, nausea, melena, diarrhea, constipation, flank pain, dysuria, hematuria, urinary  Frequency, nocturia, numbness, tingling, seizures,  Loss of consciousness,  Tremor, insomnia, and suicidal ideation.     Objective:  BP (!) 152/102 (BP Location: Left Arm, Patient Position: Sitting, Cuff Size: Normal)   Pulse 61   Temp 98.6 F (37 C) (Oral)   Resp 15   Ht 6\' 2"  (1.88 m)   Wt 234 lb 3.2 oz (106.2 kg)   SpO2 96%   BMI 30.07  kg/m   Physical Exam:  General appearance: alert, cooperative and appears stated age Ears: normal TM's and external ear canals both ears Throat: lips, mucosa, and tongue normal; teeth and gums normal Neck: no adenopathy, no carotid bruit, supple, symmetrical, trachea midline and thyroid not enlarged, symmetric, no tenderness/mass/nodules Back: symmetric, no curvature. ROM normal. No CVA tenderness. Lungs: clear to auscultation bilaterally Heart: regular rate and rhythm, S1, S2 normal, no murmur, click, rub or gallop Abdomen: soft, non-tender; bowel sounds normal; no masses,  no organomegaly Pulses: 2+ and symmetric Skin: Skin color, texture, turgor normal. No rashes or lesions Lymph nodes: Cervical, supraclavicular, and axillary nodes normal. Neuro: CNs 2-12 intact. DTRs 2+/4 in biceps, brachioradialis, patellars and achilles. Muscle strength 5/5 in upper and lower exremities. Fine resting tremor bilaterally both hands cerebellar function normal. Romberg negative.  No pronator drift.   Gait normal.   Assessment & Plan:   Problem List Items Addressed This Visit      Unprioritized   Abnormal fourth heart sound (S4)    Noted on exam today.  Patient was advised of the finding and advised to accept referral to cardiology ,  ECHO and referral in process       Relevant Orders   ECHOCARDIOGRAM COMPLETE   Ambulatory referral to Cardiology   Alcohol abuse    Advised to reduce use to no more than 2 drinks per night and return for counselling/grief management.  LFTs are normal , but triglcyerides are mildly elevated  Lab Results  Component Value Date   ALT 20 12/24/2019   AST 16 12/24/2019   ALKPHOS 77 12/24/2019   BILITOT 0.6 12/24/2019    Lab Results  Component Value Date   CHOL 173 12/24/2019   HDL 41.20 12/24/2019   LDLDIRECT 105.0 12/24/2019   TRIG 303.0 (H) 12/24/2019   CHOLHDL 4 12/24/2019         Relevant Orders   Hepatic function panel (Completed)   Cervical  spondylitis with radiculitis (HCC)    Now with subjective weakness of left arm and constant pain.  Given his prior spinal surgery  With incomplete fusion of c5-6,  I agree that an MRI is needed to evaluate patency of the spinal canal and to facilitate follow up with Neurosurgeon Haglund.        Relevant Medications   traMADol (ULTRAM) 50 MG tablet   Other Relevant Orders   MR Cervical Spine Wo Contrast   Ambulatory referral to Neurosurgery   Cervicalgia    Now with subjective weakness of left arm and constant pain.  Given his prior spinal surgery  With incomplete fusion of c5-6,  I agree that an MRI is needed to evaluate patency of the spinal canal and to facilitate follow up with Neurosurgeon Haglund.       Elevated blood pressure reading in office with diagnosis of hypertension - Primary    He has been lost to follow up with PCP for 8 years,  But the last several vital signs in Epic from other offices are all high.  Advised to stop use of NSAIDs immediately.  Amlodipine prescribed.  Renal function and urinalysis for proteinuria ordered  Lab Results  Component Value Date   MICROALBUR 1.5 12/24/2019     Lab Results  Component Value Date   CREATININE 1.01 12/24/2019   Lab Results  Component Value Date   NA 141 12/24/2019   K 4.3 12/24/2019   CL 105 12/24/2019   CO2 31 12/24/2019         Relevant Medications   amLODipine (NORVASC) 2.5 MG tablet   Other Relevant Orders   Microalbumin / creatinine urine ratio (Completed)   Basic metabolic panel (Completed)   Lipid panel (Completed)   Feeling grief    Patient is dealing with the unexpected loss of his mother and an ongoing divorce proceeding ;   i have asked patinet to return in one month to examine for signs of unresolving grief.       Hypotestosteronism    He has been receiving testosterone supplementation by Urology.  It is unclear if he has been having his PSA, lipids, or CBC monitored during the last eight years,  So I  have ordered them today and requested records from Dr Artis Flock.       Long-term use of high-risk medication    SCREENING labs for use of testosterone requested from treating physician (CBC, PSA)      Relevant Orders   CBC with Differential/Platelet      I have discontinued Vernell Barrier Testosterone (TESTIM TD) and multivitamin. I am also having him start on amLODipine and traMADol. Additionally, I am having him maintain his Testosterone.  Meds ordered this encounter  Medications  . amLODipine (NORVASC) 2.5 MG tablet    Sig: Take 1 tablet (2.5 mg total) by mouth daily.    Dispense:  90 tablet    Refill:  3  . traMADol (ULTRAM) 50 MG tablet    Sig: Take 1 tablet (50 mg total) by mouth every 8 (eight) hours as needed for up to 5 days.    Dispense:  15 tablet    Refill:  0   A total of 45 minutes of face to face time was spent with patient more than half of which was spent in counselling and coordination of care   Medications Discontinued During This Encounter  Medication Reason  . Multiple Vitamin (MULTIVITAMIN) tablet   . Testosterone (TESTIM TD)     Follow-up: Return in about 4 weeks (around 01/21/2020).   Sherlene Shams, MD

## 2019-12-25 DIAGNOSIS — Z79899 Other long term (current) drug therapy: Secondary | ICD-10-CM | POA: Insufficient documentation

## 2019-12-25 DIAGNOSIS — F1010 Alcohol abuse, uncomplicated: Secondary | ICD-10-CM | POA: Insufficient documentation

## 2019-12-25 DIAGNOSIS — F4321 Adjustment disorder with depressed mood: Secondary | ICD-10-CM | POA: Insufficient documentation

## 2019-12-25 DIAGNOSIS — I10 Essential (primary) hypertension: Secondary | ICD-10-CM | POA: Insufficient documentation

## 2019-12-25 DIAGNOSIS — M5412 Radiculopathy, cervical region: Secondary | ICD-10-CM | POA: Insufficient documentation

## 2019-12-25 DIAGNOSIS — M4692 Unspecified inflammatory spondylopathy, cervical region: Secondary | ICD-10-CM | POA: Insufficient documentation

## 2019-12-25 DIAGNOSIS — R012 Other cardiac sounds: Secondary | ICD-10-CM | POA: Insufficient documentation

## 2019-12-25 NOTE — Assessment & Plan Note (Signed)
Noted on exam today.  Patient was advised of the finding and advised to accept referral to cardiology ,  ECHO and referral in process

## 2019-12-25 NOTE — Assessment & Plan Note (Signed)
Now with subjective weakness of left arm and constant pain.  Given his prior spinal surgery  With incomplete fusion of c5-6,  I agree that an MRI is needed to evaluate patency of the spinal canal and to facilitate follow up with Neurosurgeon Haglund.

## 2019-12-25 NOTE — Progress Notes (Signed)
I wanted to let you know that your labs are normal, except your triglycerides which  may be due to non fasting state or due to over use of alcohol. .  We should repeat in 6 months.  The office will be calling you with the MRI,  ECHO and neurosurgery/cardiology referrals.  Remember that the echo and cardiology referrals have been done because I picked up a heart murmur/extra sound on your heart exam.  Please don't blow this off .    I have also requested records from Dr Artis Flock , so I did not order your PSA in the event that he has already done one in the past year.   (insurance will not pay twice in one year).  We can do it later if it has not been done.

## 2019-12-25 NOTE — Assessment & Plan Note (Addendum)
He has been receiving testosterone supplementation by Urology.  It is unclear if he has been having his PSA, lipids, or CBC monitored during the last eight years,  So I have ordered them today and requested records from Dr Artis Flock.

## 2019-12-25 NOTE — Assessment & Plan Note (Signed)
Patient is dealing with the unexpected loss of his mother and an ongoing divorce proceeding ;   i have asked patinet to return in one month to examine for signs of unresolving grief.

## 2019-12-25 NOTE — Assessment & Plan Note (Addendum)
Advised to reduce use to no more than 2 drinks per night and return for counselling/grief management.  LFTs are normal , but triglcyerides are mildly elevated  Lab Results  Component Value Date   ALT 20 12/24/2019   AST 16 12/24/2019   ALKPHOS 77 12/24/2019   BILITOT 0.6 12/24/2019    Lab Results  Component Value Date   CHOL 173 12/24/2019   HDL 41.20 12/24/2019   LDLDIRECT 105.0 12/24/2019   TRIG 303.0 (H) 12/24/2019   CHOLHDL 4 12/24/2019

## 2019-12-25 NOTE — Assessment & Plan Note (Signed)
SCREENING labs for use of testosterone requested from treating physician (CBC, PSA)

## 2019-12-25 NOTE — Assessment & Plan Note (Signed)
He has been lost to follow up with PCP for 8 years,  But the last several vital signs in Epic from other offices are all high.  Advised to stop use of NSAIDs immediately.  Amlodipine prescribed.  Renal function and urinalysis for proteinuria ordered  Lab Results  Component Value Date   MICROALBUR 1.5 12/24/2019     Lab Results  Component Value Date   CREATININE 1.01 12/24/2019   Lab Results  Component Value Date   NA 141 12/24/2019   K 4.3 12/24/2019   CL 105 12/24/2019   CO2 31 12/24/2019

## 2019-12-25 NOTE — Assessment & Plan Note (Signed)
Now with subjective weakness of left arm and constant pain.  Given his prior spinal surgery  With incomplete fusion of c5-6,  I agree that an MRI is needed to evaluate patency of the spinal canal and to facilitate follow up with Neurosurgeon Haglund.   

## 2020-01-02 ENCOUNTER — Ambulatory Visit (INDEPENDENT_AMBULATORY_CARE_PROVIDER_SITE_OTHER): Payer: BC Managed Care – PPO

## 2020-01-02 ENCOUNTER — Other Ambulatory Visit: Payer: Self-pay

## 2020-01-02 DIAGNOSIS — R012 Other cardiac sounds: Secondary | ICD-10-CM

## 2020-01-02 LAB — ECHOCARDIOGRAM COMPLETE
AR max vel: 4.25 cm2
AV Area VTI: 4.17 cm2
AV Area mean vel: 4.12 cm2
AV Mean grad: 4 mmHg
AV Peak grad: 7 mmHg
Ao pk vel: 1.32 m/s
Area-P 1/2: 2.14 cm2
Calc EF: 55 %
S' Lateral: 4 cm
Single Plane A2C EF: 54.9 %
Single Plane A4C EF: 52.8 %

## 2020-01-03 ENCOUNTER — Ambulatory Visit
Admission: RE | Admit: 2020-01-03 | Discharge: 2020-01-03 | Disposition: A | Discharge status: Home / Self Care | Payer: BC Managed Care – PPO | Source: Ambulatory Visit | Attending: Internal Medicine | Admitting: Internal Medicine

## 2020-01-03 DIAGNOSIS — M47812 Spondylosis without myelopathy or radiculopathy, cervical region: Secondary | ICD-10-CM | POA: Diagnosis not present

## 2020-01-03 DIAGNOSIS — M5412 Radiculopathy, cervical region: Secondary | ICD-10-CM | POA: Diagnosis not present

## 2020-01-03 DIAGNOSIS — M2578 Osteophyte, vertebrae: Secondary | ICD-10-CM | POA: Diagnosis not present

## 2020-01-03 DIAGNOSIS — M5021 Other cervical disc displacement,  high cervical region: Secondary | ICD-10-CM | POA: Diagnosis not present

## 2020-01-03 DIAGNOSIS — M4692 Unspecified inflammatory spondylopathy, cervical region: Secondary | ICD-10-CM | POA: Diagnosis not present

## 2020-01-03 DIAGNOSIS — M4802 Spinal stenosis, cervical region: Secondary | ICD-10-CM | POA: Diagnosis not present

## 2020-01-04 NOTE — Progress Notes (Signed)
Your heart ECHO has been resulted.  There were some very minor changes which will be addressed at your cardiology visit so please keep that appointment.  Regards,   Duncan Dull, MD

## 2020-01-06 NOTE — Progress Notes (Signed)
MRI cervical spine report received.    The previous fusion from c4 to c6 appears to be in good shape but ,there is potential for nerve root compression on both sides at C4  and The C7 nerve root on the right may be experiencing compression by a herniated disk.

## 2020-01-08 ENCOUNTER — Other Ambulatory Visit: Payer: Self-pay | Admitting: Internal Medicine

## 2020-01-08 MED ORDER — TRAMADOL HCL 50 MG PO TABS
50.0000 mg | ORAL_TABLET | Freq: Three times a day (TID) | ORAL | 0 refills | Status: AC | PRN
Start: 2020-01-08 — End: 2020-01-13

## 2020-01-18 ENCOUNTER — Ambulatory Visit: Payer: Self-pay | Admitting: Internal Medicine

## 2020-01-25 ENCOUNTER — Ambulatory Visit: Payer: BC Managed Care – PPO | Admitting: Internal Medicine

## 2020-01-25 DIAGNOSIS — M5020 Other cervical disc displacement, unspecified cervical region: Secondary | ICD-10-CM | POA: Diagnosis not present

## 2020-01-25 DIAGNOSIS — Z981 Arthrodesis status: Secondary | ICD-10-CM | POA: Diagnosis not present

## 2020-01-25 DIAGNOSIS — M542 Cervicalgia: Secondary | ICD-10-CM | POA: Diagnosis not present

## 2020-01-25 DIAGNOSIS — M5412 Radiculopathy, cervical region: Secondary | ICD-10-CM | POA: Diagnosis not present

## 2020-01-29 ENCOUNTER — Ambulatory Visit: Payer: BC Managed Care – PPO | Admitting: Cardiovascular Disease

## 2020-01-30 ENCOUNTER — Encounter: Payer: Self-pay | Admitting: Cardiovascular Disease

## 2020-02-14 ENCOUNTER — Telehealth: Payer: BC Managed Care – PPO | Admitting: Internal Medicine

## 2020-02-29 ENCOUNTER — Telehealth: Payer: BC Managed Care – PPO | Admitting: Internal Medicine

## 2020-03-04 ENCOUNTER — Encounter: Payer: BC Managed Care – PPO | Admitting: Internal Medicine

## 2020-03-11 ENCOUNTER — Ambulatory Visit: Payer: BC Managed Care – PPO | Admitting: Internal Medicine

## 2020-04-01 ENCOUNTER — Ambulatory Visit: Payer: BC Managed Care – PPO | Admitting: Internal Medicine

## 2020-04-28 ENCOUNTER — Encounter: Payer: Self-pay | Admitting: Internal Medicine

## 2020-04-28 ENCOUNTER — Ambulatory Visit: Payer: BC Managed Care – PPO | Admitting: Internal Medicine

## 2020-04-29 ENCOUNTER — Encounter: Payer: Self-pay | Admitting: Internal Medicine

## 2020-05-02 ENCOUNTER — Telehealth: Payer: Self-pay | Admitting: Internal Medicine

## 2020-05-02 NOTE — Telephone Encounter (Signed)
Patient dismissed from [facility], Dr. Rosey Bath L. Darrick Huntsman, and ALL  Primary Care Practices/Providers, effective 04/29/2020. Dismissal letter send our by [employer] Mail, yk

## 2020-06-23 DIAGNOSIS — E291 Testicular hypofunction: Secondary | ICD-10-CM | POA: Diagnosis not present

## 2020-06-23 DIAGNOSIS — N5201 Erectile dysfunction due to arterial insufficiency: Secondary | ICD-10-CM | POA: Diagnosis not present

## 2020-06-23 DIAGNOSIS — Z79899 Other long term (current) drug therapy: Secondary | ICD-10-CM | POA: Diagnosis not present

## 2020-06-25 DIAGNOSIS — Z79899 Other long term (current) drug therapy: Secondary | ICD-10-CM | POA: Diagnosis not present

## 2020-06-25 DIAGNOSIS — M4010 Other secondary kyphosis, site unspecified: Secondary | ICD-10-CM | POA: Diagnosis not present

## 2020-06-25 DIAGNOSIS — E291 Testicular hypofunction: Secondary | ICD-10-CM | POA: Diagnosis not present

## 2020-08-21 DIAGNOSIS — Z1159 Encounter for screening for other viral diseases: Secondary | ICD-10-CM | POA: Diagnosis not present

## 2020-08-21 DIAGNOSIS — Z23 Encounter for immunization: Secondary | ICD-10-CM | POA: Diagnosis not present

## 2020-08-21 DIAGNOSIS — Z13220 Encounter for screening for lipoid disorders: Secondary | ICD-10-CM | POA: Diagnosis not present

## 2020-08-21 DIAGNOSIS — Z0000 Encounter for general adult medical examination without abnormal findings: Secondary | ICD-10-CM | POA: Diagnosis not present

## 2020-08-21 DIAGNOSIS — E291 Testicular hypofunction: Secondary | ICD-10-CM | POA: Diagnosis not present

## 2020-08-21 DIAGNOSIS — N529 Male erectile dysfunction, unspecified: Secondary | ICD-10-CM | POA: Diagnosis not present

## 2020-08-21 DIAGNOSIS — I10 Essential (primary) hypertension: Secondary | ICD-10-CM | POA: Diagnosis not present

## 2020-08-21 DIAGNOSIS — B351 Tinea unguium: Secondary | ICD-10-CM | POA: Diagnosis not present

## 2020-08-21 DIAGNOSIS — Z125 Encounter for screening for malignant neoplasm of prostate: Secondary | ICD-10-CM | POA: Diagnosis not present

## 2020-08-21 DIAGNOSIS — Z131 Encounter for screening for diabetes mellitus: Secondary | ICD-10-CM | POA: Diagnosis not present

## 2020-08-21 DIAGNOSIS — Z1211 Encounter for screening for malignant neoplasm of colon: Secondary | ICD-10-CM | POA: Diagnosis not present

## 2020-09-29 DIAGNOSIS — Z79899 Other long term (current) drug therapy: Secondary | ICD-10-CM | POA: Diagnosis not present

## 2020-09-29 DIAGNOSIS — B349 Viral infection, unspecified: Secondary | ICD-10-CM | POA: Diagnosis not present

## 2020-09-29 DIAGNOSIS — R509 Fever, unspecified: Secondary | ICD-10-CM | POA: Diagnosis not present

## 2020-09-29 DIAGNOSIS — R079 Chest pain, unspecified: Secondary | ICD-10-CM | POA: Diagnosis not present

## 2020-09-29 DIAGNOSIS — Z20822 Contact with and (suspected) exposure to covid-19: Secondary | ICD-10-CM | POA: Diagnosis not present

## 2020-09-29 DIAGNOSIS — R0789 Other chest pain: Secondary | ICD-10-CM | POA: Diagnosis not present

## 2020-09-29 DIAGNOSIS — K0889 Other specified disorders of teeth and supporting structures: Secondary | ICD-10-CM | POA: Diagnosis not present

## 2020-11-20 DIAGNOSIS — Z1211 Encounter for screening for malignant neoplasm of colon: Secondary | ICD-10-CM | POA: Diagnosis not present

## 2020-11-20 DIAGNOSIS — Z6830 Body mass index (BMI) 30.0-30.9, adult: Secondary | ICD-10-CM | POA: Diagnosis not present

## 2020-11-20 DIAGNOSIS — I10 Essential (primary) hypertension: Secondary | ICD-10-CM | POA: Diagnosis not present

## 2020-11-20 DIAGNOSIS — E785 Hyperlipidemia, unspecified: Secondary | ICD-10-CM | POA: Diagnosis not present

## 2020-12-31 DIAGNOSIS — E291 Testicular hypofunction: Secondary | ICD-10-CM | POA: Diagnosis not present

## 2020-12-31 DIAGNOSIS — Z79899 Other long term (current) drug therapy: Secondary | ICD-10-CM | POA: Diagnosis not present

## 2020-12-31 DIAGNOSIS — N5201 Erectile dysfunction due to arterial insufficiency: Secondary | ICD-10-CM | POA: Diagnosis not present

## 2021-01-02 DIAGNOSIS — Z6829 Body mass index (BMI) 29.0-29.9, adult: Secondary | ICD-10-CM | POA: Diagnosis not present

## 2021-01-02 DIAGNOSIS — I10 Essential (primary) hypertension: Secondary | ICD-10-CM | POA: Diagnosis not present

## 2021-01-13 DIAGNOSIS — Z79899 Other long term (current) drug therapy: Secondary | ICD-10-CM | POA: Diagnosis not present

## 2021-01-13 DIAGNOSIS — E291 Testicular hypofunction: Secondary | ICD-10-CM | POA: Diagnosis not present

## 2021-01-13 DIAGNOSIS — N401 Enlarged prostate with lower urinary tract symptoms: Secondary | ICD-10-CM | POA: Diagnosis not present

## 2021-02-13 DIAGNOSIS — J329 Chronic sinusitis, unspecified: Secondary | ICD-10-CM | POA: Diagnosis not present

## 2021-02-13 DIAGNOSIS — I10 Essential (primary) hypertension: Secondary | ICD-10-CM | POA: Diagnosis not present

## 2021-02-13 DIAGNOSIS — R0981 Nasal congestion: Secondary | ICD-10-CM | POA: Diagnosis not present

## 2021-02-13 DIAGNOSIS — J3489 Other specified disorders of nose and nasal sinuses: Secondary | ICD-10-CM | POA: Diagnosis not present

## 2021-12-03 IMAGING — MR MR CERVICAL SPINE W/O CM
4 series · 36 of 48 positions shown · non-contrast
Comparison: 07/15/2011

CLINICAL DATA: Previous spinal fusion. Worsening neck and left
radicular pain.

EXAM:
MRI CERVICAL SPINE WITHOUT CONTRAST
TECHNIQUE: Multiplanar, multisequence MR imaging of the cervical spine was
performed. No intravenous contrast was administered.

[Series 2: T2 · sagittal · 3.0mm · 0.69mm/px · 8 of 15 slices shown (1 of 2)]
[im 1/15]
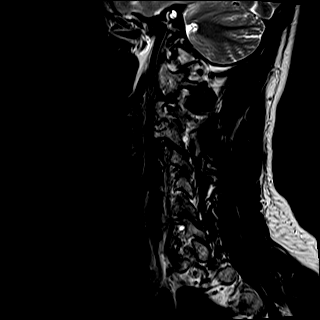
[im 3/15]
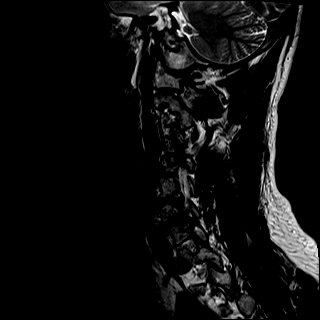
[im 5/15]
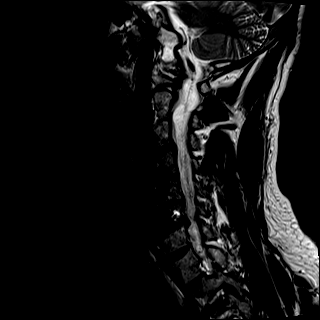
[im 7/15]
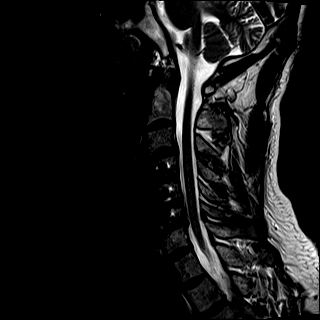
[im 9/15]
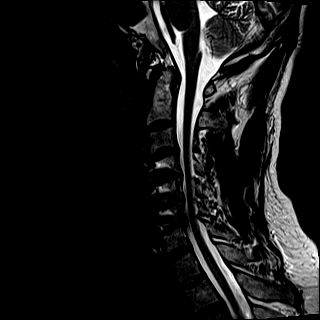
[im 11/15]
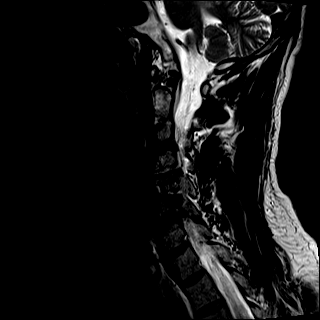
[im 13/15]
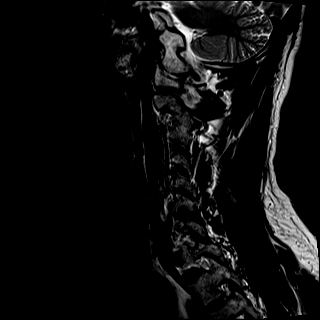
[im 15/15]
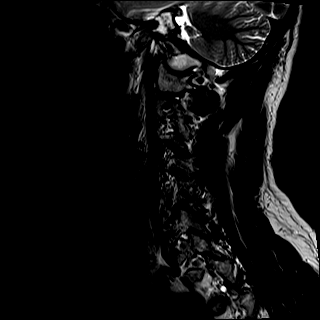

[Series 4: STIR · sagittal · 3.0mm · 0.69mm/px · 8 of 15 slices shown]
[im 1/15]
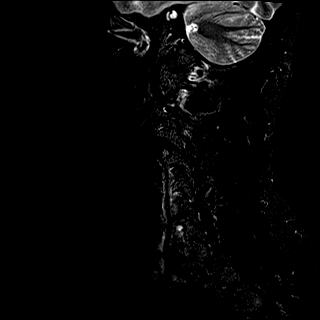
[im 3/15]
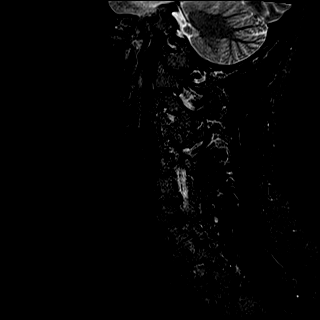
[im 5/15]
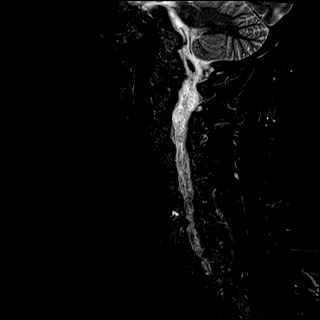
[im 7/15]
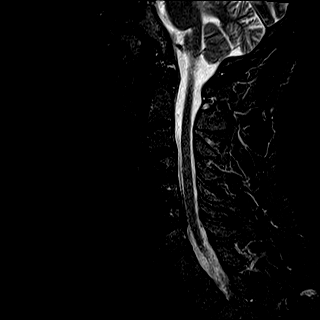
[im 9/15]
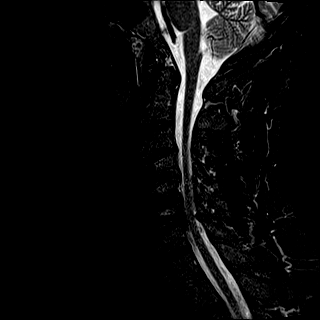
[im 11/15]
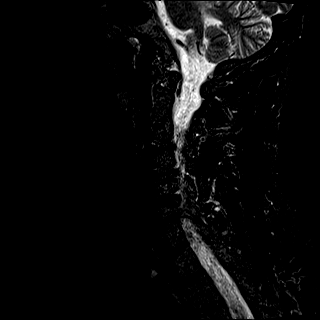
[im 13/15]
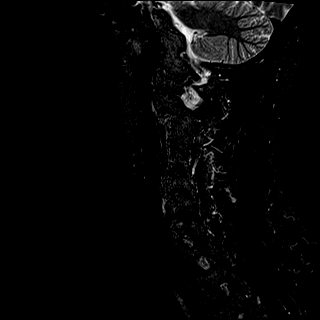
[im 15/15]
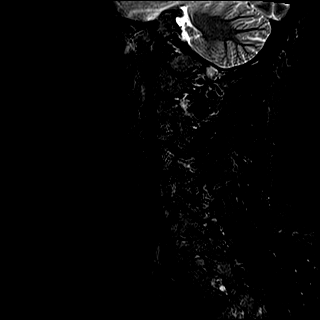

[Series 5: T2 · axial · 3.0mm · 0.62mm/px · z∈[-85,+10]mm · 10 of 28 slices shown (2 of 2)]
[im 2/28]
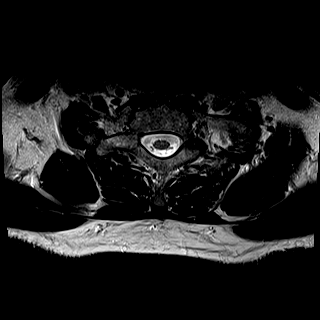
[im 4/28]
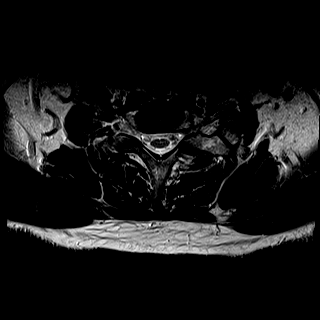
[im 6/28]
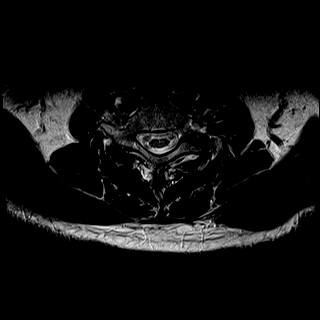
[im 10/28]
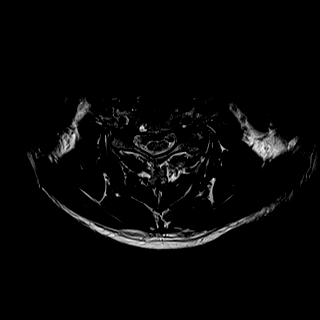
[im 13/28]
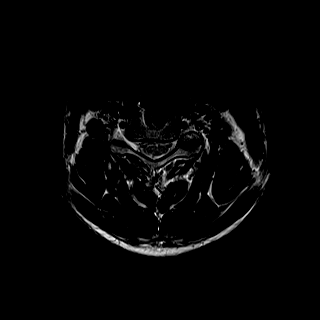
[im 15/28]
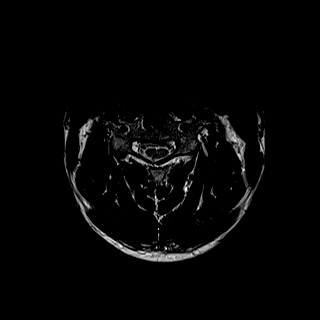
[im 17/28]
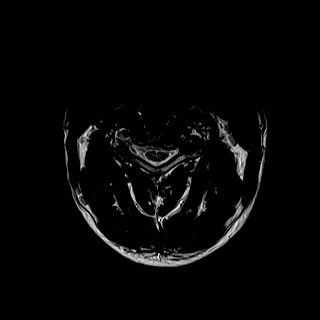
[im 20/28]
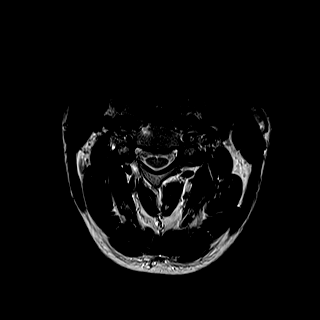
[im 24/28]
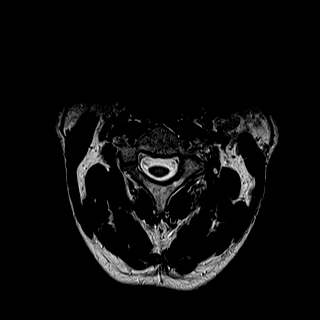
[im 28/28]
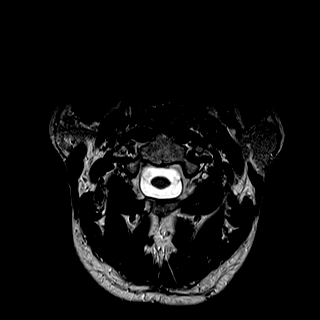

[Series 6: mpgr ax · axial · 3.0mm · 0.35mm/px · z∈[-74,+21]mm · 10 of 28 slices shown]
[im 2/28]
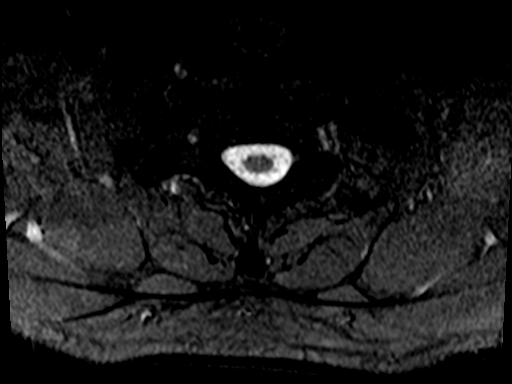
[im 4/28]
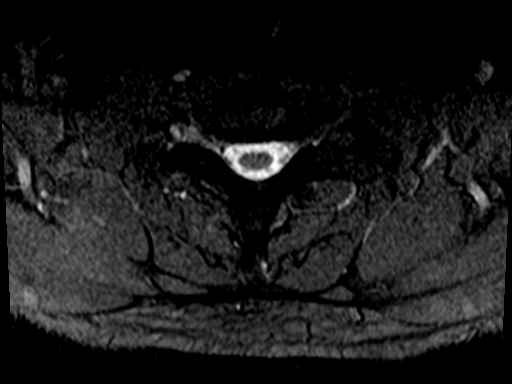
[im 6/28]
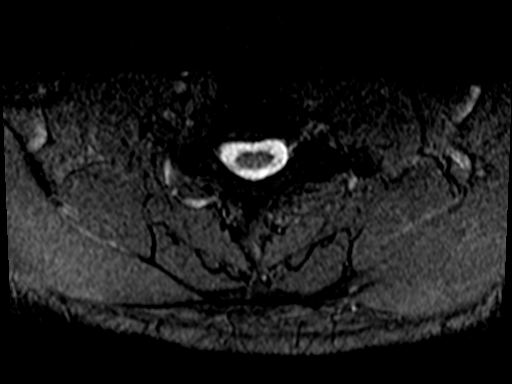
[im 10/28]
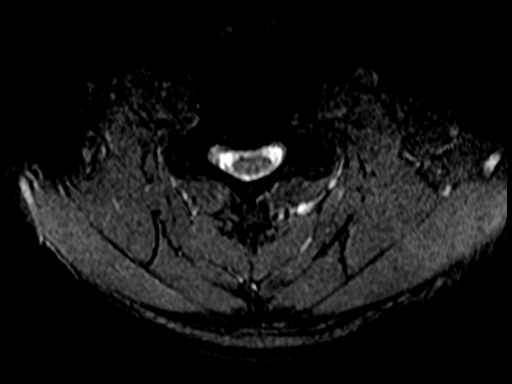
[im 13/28]
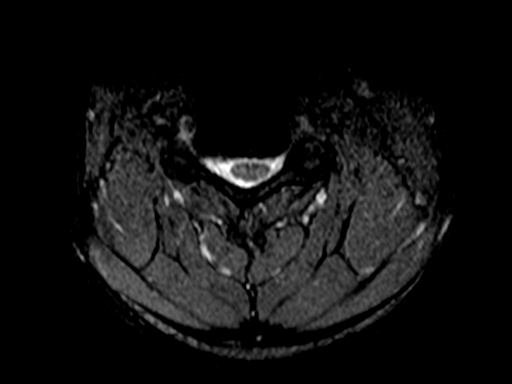
[im 15/28]
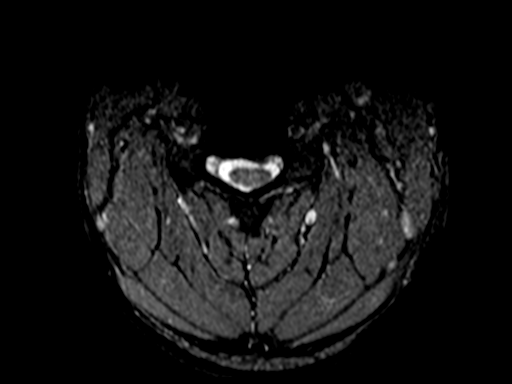
[im 17/28]
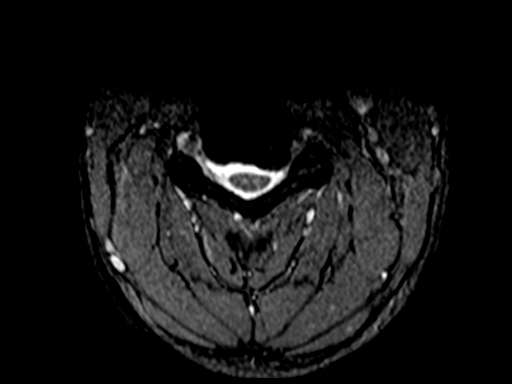
[im 20/28]
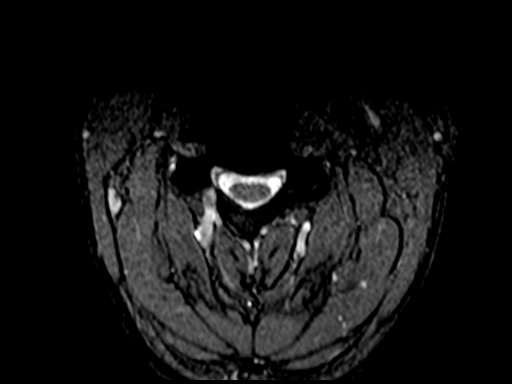
[im 24/28]
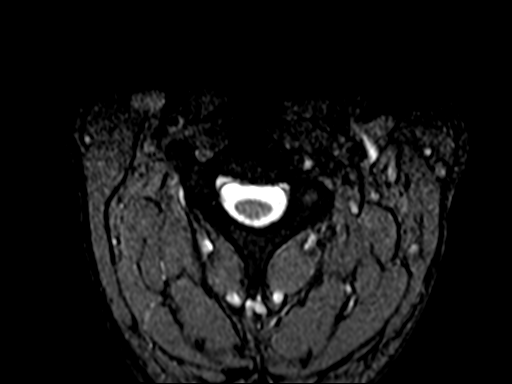
[im 28/28]
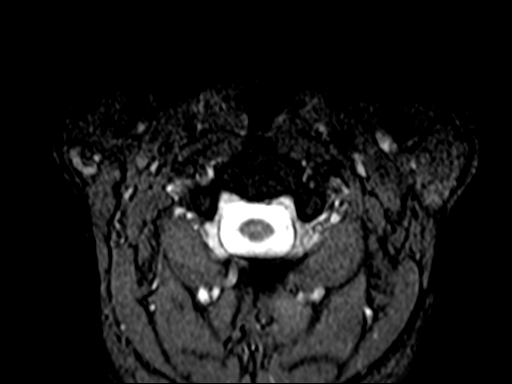

[36 of 48 positions shown; findings below may reference images not displayed]

FINDINGS: Alignment: Straightening of the normal cervical lordosis.

Vertebrae: Previous ACDF C4-5 and C5-6.

Cord: No cord compression or primary cord lesion.

Posterior Fossa, vertebral arteries, paraspinal tissues: Negative

Disc levels:

Foramen magnum and C1-2 are normal.

C2-3: No disc abnormality. Chronic facet fusion on the left. No
canal or foraminal stenosis.

C3-4: Bulging of the disc and uncovertebral prominence. Mild
bilateral facet degeneration. No central canal stenosis. Mild to
moderate bilateral bony foraminal narrowing with some potential to
affect either C4 nerve.

C4 through C6: Previous ACDF has a good appearance with apparent
solid union and sufficient patency of the canal. Chronic bony
foraminal narrowing on the left at C4-5 and bilateral at C5-6.

C6-7: Broad-based disc herniation more prominent in the right
posterolateral direction. Slight upward migration behind the
inferior endplate of C6. Effacement of the ventral subarachnoid
space but no compression of the cord. Right foraminal extension
likely to compress the right C7 nerve. Mild left foraminal narrowing
due to mild encroachment by facet osteophytes.

C7-T1: Normal interspace.
IMPRESSION: 1. Likely solid union in the fusion segment from C4 through C6.
Chronic bony foraminal narrowing on the left at C4-5 and bilateral
at C5-6.
2. Broad-based disc herniation at C6-7, more prominent in the right
posterolateral direction. Slight upward migration behind the
inferior endplate of C6. Effacement of the ventral subarachnoid
space but no compression of the cord. Right foraminal extension
likely to compress the right C7 nerve. Mild left foraminal narrowing
due to encroachment by facet osteophytes.
3. C3-4 disc bulge and uncovertebral prominence. Mild to moderate
bilateral bony foraminal narrowing with some potential to affect
either C4 nerve.
4. Chronic left facet fusion at C2-3.

## 2022-01-26 NOTE — Progress Notes (Signed)
This encounter was created in error - please disregard.
